# Patient Record
Sex: Male | Born: 1975 | Race: White | Hispanic: No | Marital: Married | Smoking: Current every day smoker
Health system: Southern US, Community
[De-identification: ages and names within clinical notes are randomized; demographics above are authoritative.]

---

## 1998-03-22 ENCOUNTER — Emergency Department (HOSPITAL_COMMUNITY): Admission: EM | Admit: 1998-03-22 | Discharge: 1998-03-22 | Payer: Self-pay | Admitting: Emergency Medicine

## 1998-04-09 ENCOUNTER — Emergency Department (HOSPITAL_COMMUNITY): Admission: EM | Admit: 1998-04-09 | Discharge: 1998-04-09 | Payer: Self-pay | Admitting: Emergency Medicine

## 1999-09-04 ENCOUNTER — Emergency Department (HOSPITAL_COMMUNITY): Admission: EM | Admit: 1999-09-04 | Discharge: 1999-09-04 | Payer: Self-pay | Admitting: Emergency Medicine

## 1999-09-10 ENCOUNTER — Emergency Department (HOSPITAL_COMMUNITY): Admission: EM | Admit: 1999-09-10 | Discharge: 1999-09-10 | Payer: Self-pay | Admitting: *Deleted

## 2002-02-17 ENCOUNTER — Emergency Department (HOSPITAL_COMMUNITY): Admission: EM | Admit: 2002-02-17 | Discharge: 2002-02-17 | Payer: Self-pay | Admitting: Emergency Medicine

## 2003-04-01 ENCOUNTER — Emergency Department (HOSPITAL_COMMUNITY): Admission: EM | Admit: 2003-04-01 | Discharge: 2003-04-01 | Payer: Self-pay

## 2017-03-11 ENCOUNTER — Emergency Department (HOSPITAL_COMMUNITY)
Admission: EM | Admit: 2017-03-11 | Discharge: 2017-03-11 | Disposition: A | Discharge status: Home / Self Care | Payer: Self-pay | Attending: Emergency Medicine | Admitting: Emergency Medicine

## 2017-03-11 ENCOUNTER — Emergency Department (HOSPITAL_COMMUNITY): Payer: Self-pay

## 2017-03-11 ENCOUNTER — Encounter (HOSPITAL_COMMUNITY): Payer: Self-pay | Admitting: *Deleted

## 2017-03-11 DIAGNOSIS — F17290 Nicotine dependence, other tobacco product, uncomplicated: Secondary | ICD-10-CM | POA: Insufficient documentation

## 2017-03-11 DIAGNOSIS — S93402A Sprain of unspecified ligament of left ankle, initial encounter: Secondary | ICD-10-CM | POA: Insufficient documentation

## 2017-03-11 DIAGNOSIS — Y929 Unspecified place or not applicable: Secondary | ICD-10-CM | POA: Insufficient documentation

## 2017-03-11 DIAGNOSIS — Y9367 Activity, basketball: Secondary | ICD-10-CM | POA: Insufficient documentation

## 2017-03-11 DIAGNOSIS — Y999 Unspecified external cause status: Secondary | ICD-10-CM | POA: Insufficient documentation

## 2017-03-11 DIAGNOSIS — X501XXA Overexertion from prolonged static or awkward postures, initial encounter: Secondary | ICD-10-CM | POA: Insufficient documentation

## 2017-03-11 NOTE — ED Notes (Signed)
Pt wanted taller crutches, but refused the ankle brace. Informed Katie - RN.

## 2017-03-11 NOTE — ED Notes (Signed)
Pt refused ASO.  States he has a brace at home.  Patient did want crutches due to his not being for the correct height.

## 2017-03-11 NOTE — ED Notes (Signed)
Patient able to ambulate independently  

## 2017-03-11 NOTE — Discharge Instructions (Signed)
Please use rest, ice, compression, elevation to affected joint. Take ibuprofen or naproxen as needed for pain and swelling. Please follow-up with Dr. Logan BoresEvans, podiatrist, regarding today's visit in 1-2 weeks.  Get help right away if: Your toes or foot becomes numb or blue. You have severe pain that gets worse.

## 2017-03-11 NOTE — ED Notes (Signed)
Patient transported to x-ray. ?

## 2017-03-11 NOTE — ED Triage Notes (Signed)
Pt reports L ankle injury yesterday when his L ankle rolled outward and heard a pop while playing basketball, pt has swelling to area, pt declines wheelchair, pt has kids crutches upon arrival to ED, A&O x4

## 2017-03-11 NOTE — ED Provider Notes (Signed)
[facility]-EMERGENCY DEPT Provider Note   CSN: [id number] Arrival date & time: 03/11/17  1508  By signing my name below, I, Diona BrownerJennifer Gorman, attest that this documentation has been prepared under the direction and in the presence of Santiana Glidden, [place]. Electronically Signed: Diona BrownerJennifer Gorman, ED Scribe. 03/11/17. 4:30 PM.  History   Chief Complaint Chief Complaint  Patient presents with  . Ankle Pain    HPI Antonio Morris is a 41 y.o. male who presents to the Emergency Department complaining of gradually worsening, constant, throbbing left ankle pain that started yesterday, 03/10/17. Pt reports he was playing basketball when he rolled his ankle outward, and heard a popping noise. He rates his pain a 10/10 severity. Associated sx include ankle swelling and diaphoresis. Pt evaluated his foot and put ice on it with mild relief. He also notes taking ibuprofen with some relief. He is unable to bare weight on his ankle. Pt denies LOC, head injury, fever, nausea, vomiting and chills.   The history is provided by the patient. No language interpreter was used.    History reviewed. No pertinent past medical history.  There are no active problems to display for this patient.   History reviewed. No pertinent surgical history.     Home Medications    Prior to Admission medications   Not on File    Family History No family history on file.  Social History Social History  Substance Use Topics  . Smoking status: Current Every Day Smoker    Packs/day: 1.00    Types: Cigars  . Smokeless tobacco: Never Used  . Alcohol use Yes     Allergies   Patient has no allergy information on record.   Review of Systems Review of Systems  Constitutional: Positive for diaphoresis. Negative for chills and fever.  Gastrointestinal: Negative for nausea and vomiting.  Musculoskeletal: Positive for joint swelling.  Neurological: Negative for syncope.     Physical Exam Updated Vital Signs BP  122/81 (BP Location: Right Arm)   Pulse 70   Temp 98.6 F (37 C) (Oral)   Resp 17   Ht 5\' 7"  (1.702 m)   Wt 90.7 kg (200 lb)   SpO2 100%   BMI 31.32 kg/m   Physical Exam  Constitutional: He appears well-developed and well-nourished. No distress.  Well appearing  HENT:  Head: Normocephalic and atraumatic.  Nose: Nose normal.  Eyes: Conjunctivae and EOM are normal.  Neck: Normal range of motion.  Cardiovascular: Normal rate and intact distal pulses.   No murmur heard. 2+ pulses to BLE  Pulmonary/Chest: Effort normal. No respiratory distress.  Normal work of breathing. No respiratory distress noted.   Abdominal: Soft.  Musculoskeletal: Normal range of motion. He exhibits tenderness.  Limited range of motion of left ankle secondary to pain and swelling. Minimal redness noted to distal tibia. Moderate bruising noted underneath lateral malleolus. Tenderness to palpation to lateral and medial malleolus. Tenderness over ATFL. No obvious wound or deformity noted.  Neurological: He is alert. No sensory deficit.  Sensation intact. Muscle strength good against resistance to plantar flexion and dorsiflexion of left foot but did produce pain.   Skin: Skin is warm. Capillary refill takes less than 2 seconds.  Psychiatric: He has a normal mood and affect. His behavior is normal.  Nursing note and vitals reviewed.    ED Treatments / Results  DIAGNOSTIC STUDIES: Oxygen Saturation is 100% on RA, normal by my interpretation.   COORDINATION OF CARE: 4:30 PM-Discussed next steps with  pt which includes following up with an orthopedic doctor and taking ibuprofen for pain. Rest, ice and compression for a few days. Pt verbalized understanding and is agreeable with the plan.    Labs (all labs ordered are listed, but only abnormal results are displayed) Labs Reviewed - No data to display  EKG  EKG Interpretation None       Radiology Dg Ankle Complete Left  Result Date:  03/11/2017 CLINICAL DATA:  Pain after basketball injury yesterday. EXAM: LEFT ANKLE COMPLETE - 3+ VIEW COMPARISON:  None. FINDINGS: Soft tissue swelling about the malleoli. Intact ankle mortise. No acute fracture nor dislocation. The ankle and subtalar joints are maintained. There is a small ankle joint effusion. Tiny plantar calcaneal enthesophyte is noted. IMPRESSION: 1. Soft tissue swelling about the malleoli. 2. No acute fracture nor dislocation. 3. Small ankle joint effusion. Electronically Signed   By: Tollie Eth M.D.   On: 03/11/2017 16:19    Procedures Procedures (including critical care time)  Medications Ordered in ED Medications - No data to display   Initial Impression / Assessment and Plan / ED Course  I have reviewed the triage vital signs and the nursing notes.  Pertinent labs & imaging results that were available during my care of the patient were reviewed by me and considered in my medical decision making (see chart for details).     Patient X-Ray negative for obvious fracture or dislocation. Pain managed in ED. Pt advised to follow up with podiatry if symptoms persist for possibility of missed fracture diagnosis. Patient given brace and crutches while in ED, conservative therapy recommended and discussed. Patient in NAD, hemodynamically stable, afebrile. Patient will be dc home & is agreeable with above plan. Return precautions given.  Final Clinical Impressions(s) / ED Diagnoses   Final diagnoses:  Sprain of left ankle, unspecified ligament, initial encounter    New Prescriptions New Prescriptions   No medications on file   I personally performed the services described in this documentation, which was scribed in my presence. The recorded information has been reviewed and is accurate.    Candie Mile Campo, [place] 03/11/17 1644    Raeford Razor, MD 03/17/17 2010

## 2018-05-29 IMAGING — DX DG ANKLE COMPLETE 3+V*L*
3 series · 3 of 3 positions shown · non-contrast
Comparison: None.

CLINICAL DATA: Pain after basketball injury yesterday.

EXAM:
LEFT ANKLE COMPLETE - 3+ VIEW

[ankle ap]
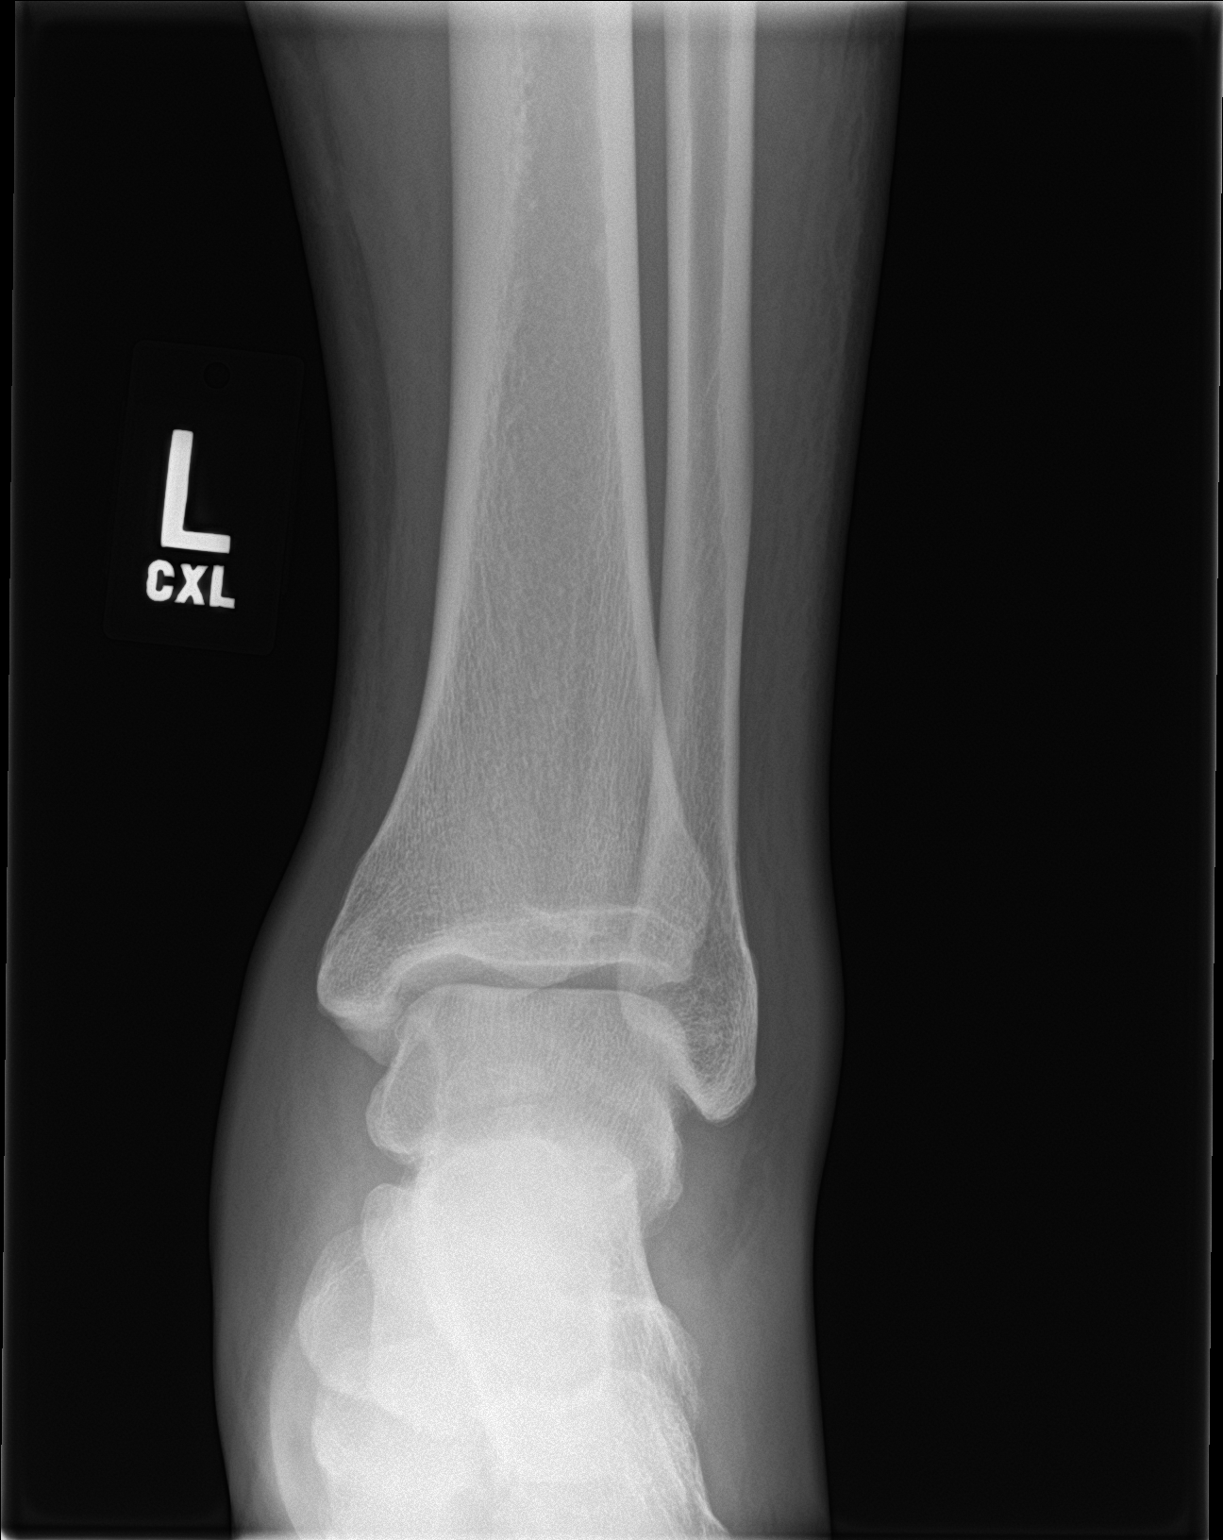

[ankle obl]
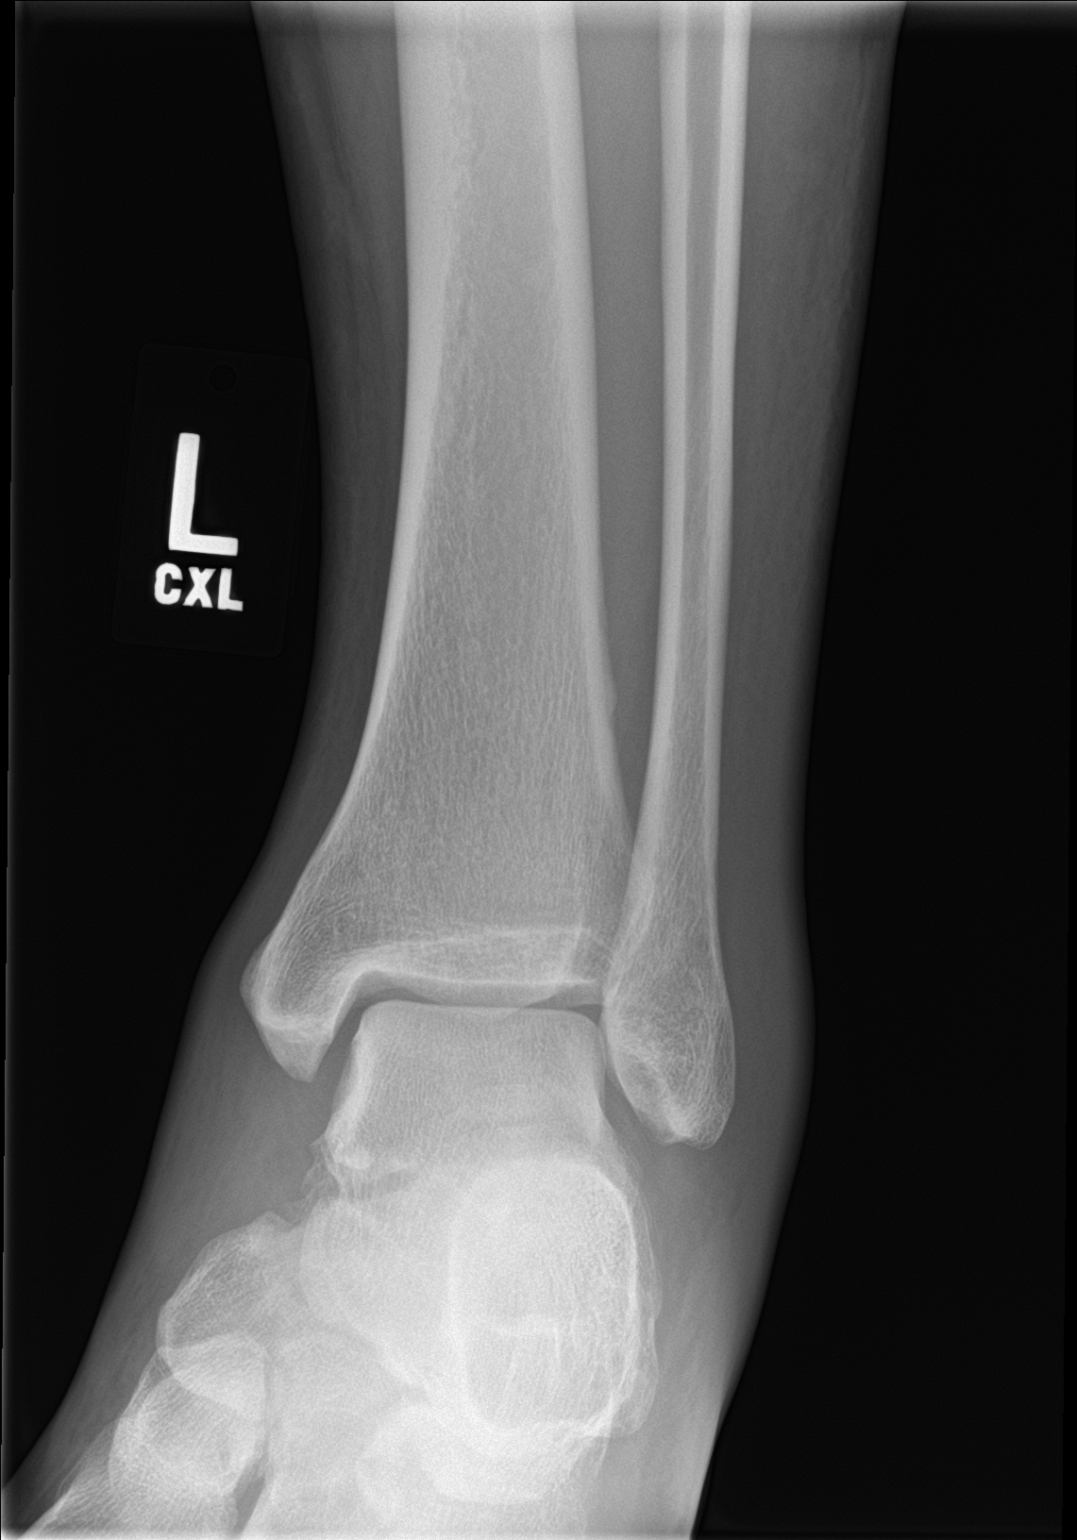

[ankle lat]
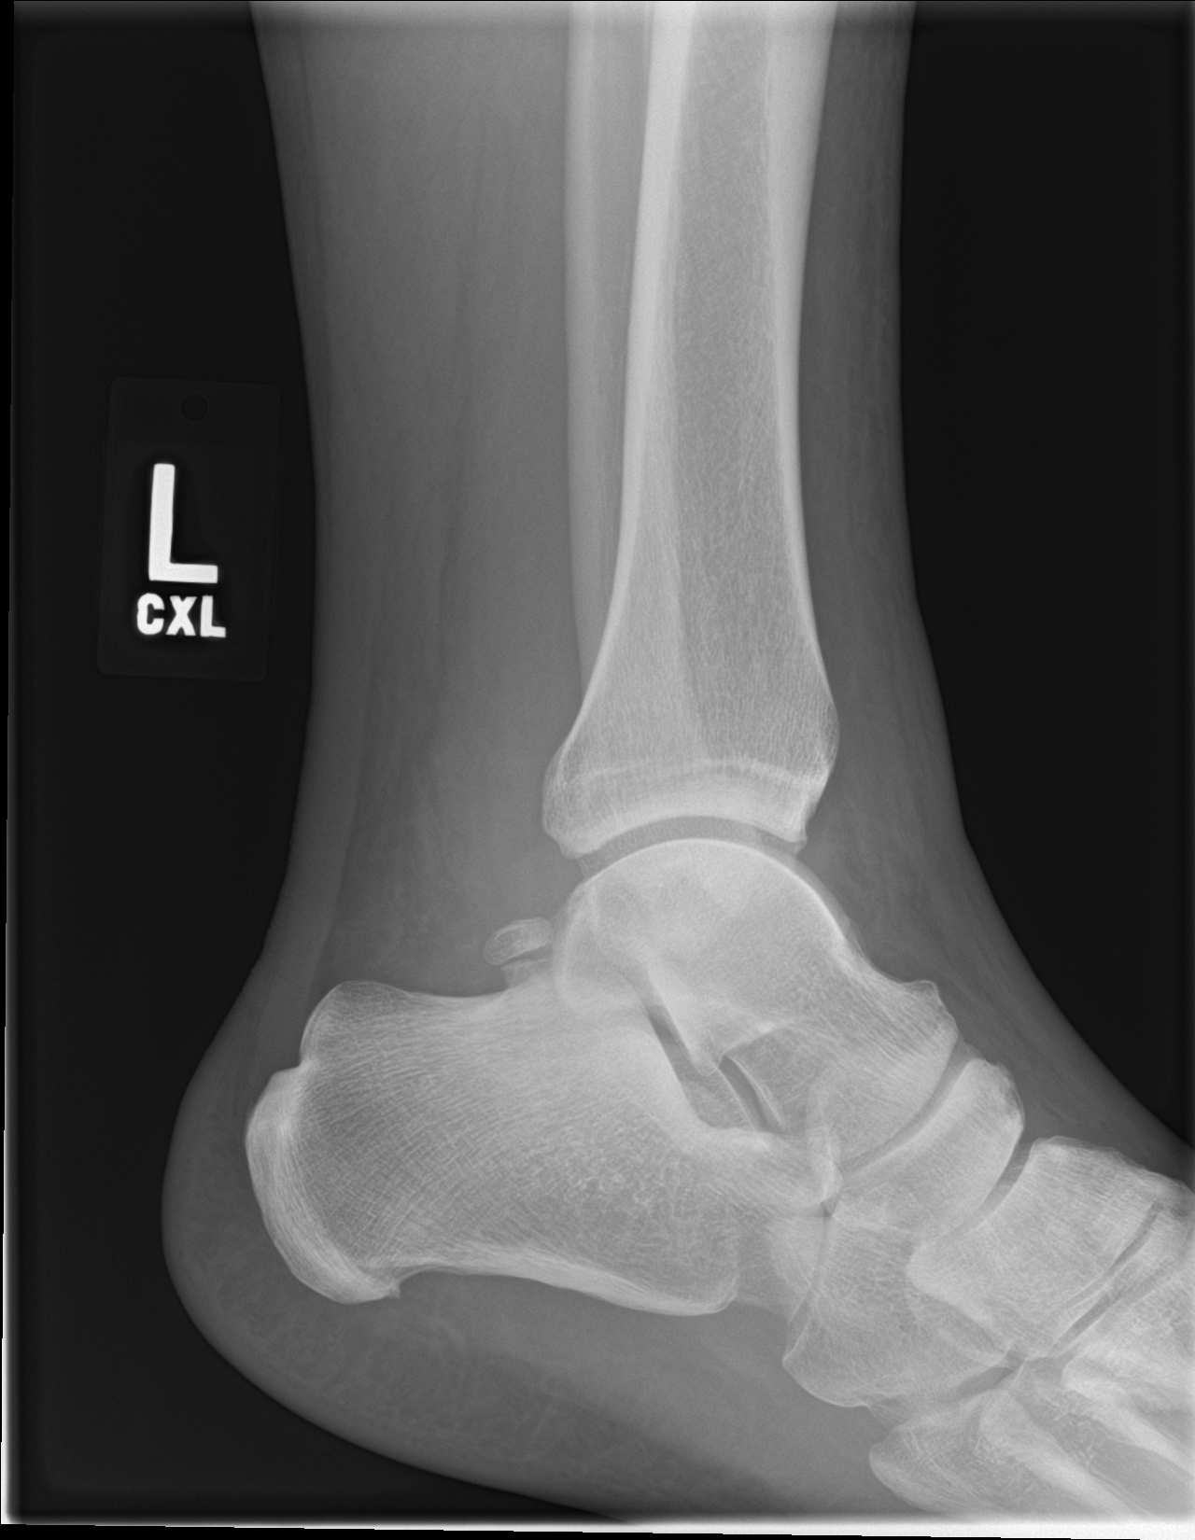

[3 of 3 positions shown; findings below may reference images not displayed]

FINDINGS: Soft tissue swelling about the malleoli. Intact ankle mortise. No
acute fracture nor dislocation. The ankle and subtalar joints are
maintained. There is a small ankle joint effusion. Tiny plantar
calcaneal enthesophyte is noted.
IMPRESSION: 1. Soft tissue swelling about the malleoli.
2. No acute fracture nor dislocation.
3. Small ankle joint effusion.

## 2019-09-03 ENCOUNTER — Other Ambulatory Visit: Payer: Self-pay

## 2019-09-03 DIAGNOSIS — Z20822 Contact with and (suspected) exposure to covid-19: Secondary | ICD-10-CM

## 2019-09-07 LAB — NOVEL CORONAVIRUS, NAA: SARS-CoV-2, NAA: NOT DETECTED
